# Patient Record
Sex: Female | Born: 1995 | Race: White | Hispanic: No | Marital: Single
Health system: Southern US, Community
[De-identification: ages and names within clinical notes are randomized; demographics above are authoritative.]

---
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
## 2020-03-28 ENCOUNTER — Telehealth: Payer: Self-pay | Admitting: Emergency Medicine

## 2020-03-28 MED ORDER — SULFAMETHOXAZOLE-TRIMETHOPRIM 800-160 MG PO TABS: 1.0000 | ORAL_TABLET | Freq: Two times a day (BID) | ORAL | 0 refills | Status: AC

## 2020-03-28 NOTE — Telephone Encounter (Signed)
Medication was sent
